# Patient Record
Sex: Female | Born: 1975 | Hispanic: No | Marital: Married | Smoking: Never smoker
Health system: Southern US, Community
[De-identification: ages and names within clinical notes are randomized; demographics above are authoritative.]

## PROBLEM LIST (undated history)

## (undated) DIAGNOSIS — G43909 Migraine, unspecified, not intractable, without status migrainosus: Secondary | ICD-10-CM

## (undated) DIAGNOSIS — R569 Unspecified convulsions: Secondary | ICD-10-CM

## (undated) DIAGNOSIS — L719 Rosacea, unspecified: Secondary | ICD-10-CM

## (undated) DIAGNOSIS — Z87440 Personal history of urinary (tract) infections: Secondary | ICD-10-CM

## (undated) HISTORY — DX: Migraine, unspecified, not intractable, without status migrainosus: G43.909

## (undated) HISTORY — DX: Rosacea, unspecified: L71.9

## (undated) HISTORY — PX: NO PAST SURGERIES: SHX2092

## (undated) HISTORY — DX: Unspecified convulsions: R56.9

## (undated) HISTORY — DX: Personal history of urinary (tract) infections: Z87.440

---

## 2001-04-11 ENCOUNTER — Encounter: Admission: RE | Admit: 2001-04-11 | Discharge: 2001-04-11 | Payer: Self-pay | Admitting: Obstetrics and Gynecology

## 2001-04-11 ENCOUNTER — Encounter: Payer: Self-pay | Admitting: Obstetrics and Gynecology

## 2001-06-22 ENCOUNTER — Ambulatory Visit (HOSPITAL_COMMUNITY): Admission: RE | Admit: 2001-06-22 | Discharge: 2001-06-22 | Payer: Self-pay

## 2001-08-25 ENCOUNTER — Encounter: Payer: Self-pay | Admitting: *Deleted

## 2001-08-25 ENCOUNTER — Ambulatory Visit (HOSPITAL_COMMUNITY): Admission: RE | Admit: 2001-08-25 | Discharge: 2001-08-25 | Payer: Self-pay | Admitting: *Deleted

## 2001-09-14 ENCOUNTER — Ambulatory Visit (HOSPITAL_COMMUNITY): Admission: RE | Admit: 2001-09-14 | Discharge: 2001-09-14 | Payer: Self-pay | Admitting: *Deleted

## 2001-09-14 ENCOUNTER — Encounter: Payer: Self-pay | Admitting: *Deleted

## 2001-11-16 ENCOUNTER — Inpatient Hospital Stay (HOSPITAL_COMMUNITY): Admission: AD | Admit: 2001-11-16 | Discharge: 2001-11-16 | Payer: Self-pay

## 2001-12-14 ENCOUNTER — Encounter (HOSPITAL_COMMUNITY): Admission: RE | Admit: 2001-12-14 | Discharge: 2002-01-06 | Payer: Self-pay

## 2002-01-07 ENCOUNTER — Inpatient Hospital Stay (HOSPITAL_COMMUNITY): Admission: AD | Admit: 2002-01-07 | Discharge: 2002-01-10 | Payer: Self-pay

## 2002-01-11 ENCOUNTER — Encounter: Admission: RE | Admit: 2002-01-11 | Discharge: 2002-02-10 | Payer: Self-pay

## 2003-06-07 ENCOUNTER — Emergency Department (HOSPITAL_COMMUNITY): Admission: EM | Admit: 2003-06-07 | Discharge: 2003-06-07 | Payer: Self-pay | Admitting: Emergency Medicine

## 2003-06-25 ENCOUNTER — Emergency Department (HOSPITAL_COMMUNITY): Admission: EM | Admit: 2003-06-25 | Discharge: 2003-06-25 | Payer: Self-pay

## 2003-06-26 ENCOUNTER — Encounter: Admission: RE | Admit: 2003-06-26 | Discharge: 2003-06-26 | Payer: Self-pay | Admitting: Family Medicine

## 2003-07-17 ENCOUNTER — Encounter: Admission: RE | Admit: 2003-07-17 | Discharge: 2003-09-09 | Payer: Self-pay | Admitting: Chiropractic Medicine

## 2004-09-02 ENCOUNTER — Encounter: Admission: RE | Admit: 2004-09-02 | Discharge: 2004-09-02 | Payer: Self-pay | Admitting: Family Medicine

## 2005-06-02 ENCOUNTER — Other Ambulatory Visit: Admission: RE | Admit: 2005-06-02 | Discharge: 2005-06-02 | Payer: Self-pay | Admitting: Obstetrics and Gynecology

## 2005-12-29 ENCOUNTER — Encounter: Admission: RE | Admit: 2005-12-29 | Discharge: 2005-12-29 | Payer: Self-pay | Admitting: Family Medicine

## 2007-06-13 ENCOUNTER — Other Ambulatory Visit: Admission: RE | Admit: 2007-06-13 | Discharge: 2007-06-13 | Payer: Self-pay | Admitting: Obstetrics and Gynecology

## 2009-04-17 ENCOUNTER — Other Ambulatory Visit: Admission: RE | Admit: 2009-04-17 | Discharge: 2009-04-17 | Payer: Self-pay | Admitting: Obstetrics and Gynecology

## 2009-04-30 ENCOUNTER — Encounter: Admission: RE | Admit: 2009-04-30 | Discharge: 2009-04-30 | Payer: Self-pay | Admitting: Obstetrics and Gynecology

## 2012-05-27 ENCOUNTER — Other Ambulatory Visit: Payer: Self-pay | Admitting: Neurology

## 2014-11-22 ENCOUNTER — Other Ambulatory Visit: Payer: Self-pay

## 2014-11-22 DIAGNOSIS — Z1231 Encounter for screening mammogram for malignant neoplasm of breast: Secondary | ICD-10-CM

## 2018-07-19 ENCOUNTER — Encounter: Payer: Self-pay | Admitting: Family Medicine

## 2018-07-19 ENCOUNTER — Other Ambulatory Visit: Payer: Self-pay

## 2018-07-19 ENCOUNTER — Ambulatory Visit (INDEPENDENT_AMBULATORY_CARE_PROVIDER_SITE_OTHER): Payer: Managed Care, Other (non HMO) | Admitting: Family Medicine

## 2018-07-19 VITALS — BP 112/62 | HR 92 | Temp 98.7°F | Ht 68.0 in | Wt 166.4 lb

## 2018-07-19 DIAGNOSIS — Z0000 Encounter for general adult medical examination without abnormal findings: Secondary | ICD-10-CM | POA: Diagnosis not present

## 2018-07-19 NOTE — Progress Notes (Signed)
Chief Complaint  Patient presents with  . New Patient (Initial Visit)     Well Woman DELCIE Villa is here for a complete physical.   Her last physical was >1 year ago.  Current diet: in general, an "OK" diet. Current exercise: none. Weight is increasing and she denies daytime fatigue. No LMP recorded.  Seatbelt? Yes  Health Maintenance Pap/HPV- Yes 1 mo ago Mammogram- Yes Due Tetanus- Yes- 2 years ago HIV screening- Yes - 16 yrs ago  Past Medical History:  Diagnosis Date  . History of recurrent UTIs   . Migraines   . Seizures ([place])      History reviewed. No pertinent surgical history.  Medications  Takes no meds routinely.    Allergies No Known Allergies  Review of Systems: Constitutional:  no unexpected weight changes Eye:  Vision has started to get worse Ear/Nose/Mouth/Throat:  Ears:  no tinnitus or vertigo and no recent change in hearing Nose/Mouth/Throat:  no complaints of nasal congestion, no sore throat Cardiovascular: no chest pain Respiratory:  no cough and no shortness of breath Gastrointestinal:  no abdominal pain, no change in bowel habits GU:  Female: negative for dysuria or pelvic pain Musculoskeletal/Extremities:  no pain of the joints Integumentary (Skin/Breast):  no abnormal skin lesions reported Neurologic:  no headaches Endocrine:  denies fatigue Hematologic/Lymphatic:  No areas of easy bleeding  Exam BP 112/62 (BP Location: Left Arm, Patient Position: Sitting, Cuff Size: Normal)   Pulse 92   Temp 98.7 F (37.1 C) (Oral)   Ht 5\' 8"  (1.727 m)   Wt 166 lb 6 oz (75.5 kg)   SpO2 98%   BMI 25.30 kg/m  General:  well developed, well nourished, in no apparent distress Skin:  no significant moles, warts, or growths Head:  no masses, lesions, or tenderness Eyes:  pupils equal and round, sclera anicteric without injection Ears:  canals without lesions, TMs shiny without retraction, no obvious effusion, no erythema Nose:  nares patent,  septum midline, mucosa normal, and no drainage or sinus tenderness Throat/Pharynx:  lips and gingiva without lesion; tongue and uvula midline; non-inflamed pharynx; no exudates or postnasal drainage Neck: neck supple without adenopathy, thyromegaly, or masses Lungs:  clear to auscultation, breath sounds equal bilaterally, no respiratory distress Cardio:  regular rate and rhythm, no bruits, no LE edema Abdomen:  abdomen soft, nontender; bowel sounds normal; no masses or organomegaly Genital: Defer to GYN Musculoskeletal:  symmetrical muscle groups noted without atrophy or deformity Extremities:  no clubbing, cyanosis, or edema, no deformities, no skin discoloration Neuro:  gait normal; deep tendon reflexes normal and symmetric Psych: well oriented with normal range of affect and appropriate judgment/insight  Assessment and Plan  Well adult exam - Plan: CBC, Comprehensive metabolic panel, Lipid panel  Well 43 y.o. female. Counseled on diet and exercise. Yoga, lift wts.  Other orders as above. Follow up 1 yr for CPE or prn. The patient voiced understanding and agreement to the plan.  [place], DO 07/19/18 1:48 PM

## 2018-07-19 NOTE — Patient Instructions (Signed)
Keep the diet clean and stay active.  Give [place] 2-3 business days to get the results of your labs back.   Aim to do some physical exertion for 150 minutes per week. This is typically divided into 5 days per week, 30 minutes per day. The activity should be enough to get your heart rate up. Anything is better than nothing if you have time constraints. Consider yoga and lifting weights.  Let us know if you need anything.

## 2019-02-08 ENCOUNTER — Other Ambulatory Visit: Payer: Self-pay

## 2019-02-08 ENCOUNTER — Ambulatory Visit (INDEPENDENT_AMBULATORY_CARE_PROVIDER_SITE_OTHER): Payer: Managed Care, Other (non HMO) | Admitting: Internal Medicine

## 2019-02-08 ENCOUNTER — Telehealth: Payer: Self-pay

## 2019-02-08 ENCOUNTER — Encounter: Payer: Self-pay | Admitting: Internal Medicine

## 2019-02-08 DIAGNOSIS — L719 Rosacea, unspecified: Secondary | ICD-10-CM

## 2019-02-08 MED ORDER — DOXYCYCLINE HYCLATE 50 MG PO CAPS: 50.0000 mg | ORAL_CAPSULE | Freq: Two times a day (BID) | ORAL | 0 refills | Status: DC

## 2019-02-08 MED ORDER — METRONIDAZOLE 0.75 % EX CREA: TOPICAL_CREAM | Freq: Two times a day (BID) | CUTANEOUS | 3 refills | Status: DC

## 2019-02-08 NOTE — Telephone Encounter (Signed)
Copied from CRM [id number]. Topic: Appointment Scheduling - Scheduling Inquiry for Clinic >> Feb 08, 2019 10:32 AM Fanny Bien wrote: Reason for CRM: pt called and stated that she has rosea on both of her cheeks and would like to schedule an appointment today. Please advise

## 2019-02-08 NOTE — Progress Notes (Signed)
Subjective:    Patient ID: Pamela Villa, female    DOB: [birth year], 44 y.o.   MRN: [id number]  DOS:  02/08/2019 Type of visit - description: Virtual Visit via Video Note  I connected with above patient by a video enabled telemedicine application and verified that I am speaking with the correct person using two identifiers.   THIS ENCOUNTER IS A VIRTUAL VISIT DUE TO COVID-19 - PATIENT WAS NOT SEEN IN THE OFFICE. PATIENT HAS CONSENTED TO VIRTUAL VISIT / TELEMEDICINE VISIT   Location of patient: home  Location of provider: office  I discussed the limitations of evaluation and management by telemedicine and the availability of in person appointments. The patient expressed understanding and agreed to proceed.  Acute visit History of rosacea, diagnosed 3.5 years ago by dermatology. She responded well to po antibiotics. She has been well without any medication for a while but sxs  resurfaced lately.    Review of Systems Denies any other problems  Past Medical History:  Diagnosis Date  . History of recurrent UTIs   . Migraines   . Seizures ([place])     No past surgical history on file.  Social History   Socioeconomic History  . Marital status: Married    Spouse name: Not on file  . Number of children: Not on file  . Years of education: Not on file  . Highest education level: Not on file  Occupational History  . Not on file  Tobacco Use  . Smoking status: Never Smoker  . Smokeless tobacco: Never Used  Substance and Sexual Activity  . Alcohol use: Not on file    Comment: rarely  . Drug use: Never  . Sexual activity: Not on file  Other Topics Concern  . Not on file  Social History Narrative  . Not on file   Social Determinants of Health   Financial Resource Strain:   . Difficulty of Paying Living Expenses: Not on file  Food Insecurity:   . Worried About Charity fundraiser in the Last Year: Not on file  . Ran Out of Food in the Last Year: Not on file    Transportation Needs:   . Lack of Transportation (Medical): Not on file  . Lack of Transportation (Non-Medical): Not on file  Physical Activity:   . Days of Exercise per Week: Not on file  . Minutes of Exercise per Session: Not on file  Stress:   . Feeling of Stress : Not on file  Social Connections:   . Frequency of Communication with Friends and Family: Not on file  . Frequency of Social Gatherings with Friends and Family: Not on file  . Attends Religious Services: Not on file  . Active Member of Clubs or Organizations: Not on file  . Attends Archivist Meetings: Not on file  . Marital Status: Not on file  Intimate Partner Violence:   . Fear of Current or Ex-Partner: Not on file  . Emotionally Abused: Not on file  . Physically Abused: Not on file  . Sexually Abused: Not on file      Allergies as of 02/08/2019   No Known Allergies     Medication List    as of February 08, 2019  1:22 PM   You have not been prescribed any medications.         Objective:   Physical Exam HENT:     Head:     There were no vitals taken for this  visit. This is a virtual video visit, she is alert oriented x3, I was able to see her face, she does have some redness.  See graphic    Assessment    44 year old female, with history of rosacea presents with  Rosacea exacerbation. Currently taking no medication other than BCPs, rosacea is now exacerbated. Previously doxycycline 100 mg twice a day help but it caused some stomach discomfort. Plan: Doxycycline 50 mg twice a day for 1 month Start metronidazole topically. Reassess in 6 weeks by PCP, may need long-term doxycycline. She is aware that BCPs will make BCPs less effective, recommend additional measures, condoms?.    I discussed the assessment and treatment plan with the patient. The patient was provided an opportunity to ask questions and all were answered. The patient agreed with the plan and demonstrated an understanding  of the instructions.   The patient was advised to call back or seek an in-person evaluation if the symptoms worsen or if the condition fails to improve as anticipated.

## 2019-02-08 NOTE — Telephone Encounter (Signed)
LM requesting call back to schedule appt with PCP and discuss rosacea.

## 2019-03-04 ENCOUNTER — Other Ambulatory Visit: Payer: Self-pay | Admitting: Internal Medicine

## 2019-07-04 ENCOUNTER — Encounter: Payer: Self-pay | Admitting: Family Medicine

## 2019-08-10 ENCOUNTER — Emergency Department (HOSPITAL_BASED_OUTPATIENT_CLINIC_OR_DEPARTMENT_OTHER)
Admission: EM | Admit: 2019-08-10 | Discharge: 2019-08-10 | Disposition: A | Discharge status: Home / Self Care | Payer: Managed Care, Other (non HMO) | Attending: Emergency Medicine | Admitting: Emergency Medicine

## 2019-08-10 ENCOUNTER — Other Ambulatory Visit: Payer: Self-pay

## 2019-08-10 ENCOUNTER — Encounter (HOSPITAL_BASED_OUTPATIENT_CLINIC_OR_DEPARTMENT_OTHER): Payer: Self-pay | Admitting: *Deleted

## 2019-08-10 ENCOUNTER — Emergency Department (HOSPITAL_BASED_OUTPATIENT_CLINIC_OR_DEPARTMENT_OTHER): Payer: Managed Care, Other (non HMO)

## 2019-08-10 DIAGNOSIS — R202 Paresthesia of skin: Secondary | ICD-10-CM | POA: Insufficient documentation

## 2019-08-10 DIAGNOSIS — R42 Dizziness and giddiness: Secondary | ICD-10-CM | POA: Diagnosis not present

## 2019-08-10 DIAGNOSIS — I10 Essential (primary) hypertension: Secondary | ICD-10-CM | POA: Insufficient documentation

## 2019-08-10 DIAGNOSIS — R072 Precordial pain: Secondary | ICD-10-CM | POA: Insufficient documentation

## 2019-08-10 DIAGNOSIS — Z3202 Encounter for pregnancy test, result negative: Secondary | ICD-10-CM | POA: Diagnosis not present

## 2019-08-10 LAB — BASIC METABOLIC PANEL
Anion gap: 12 (ref 5–15)
BUN: 9 mg/dL (ref 6–20)
CO2: 21 mmol/L — ABNORMAL LOW (ref 22–32)
Calcium: 8.6 mg/dL — ABNORMAL LOW (ref 8.9–10.3)
Chloride: 102 mmol/L (ref 98–111)
Creatinine, Ser: 0.91 mg/dL (ref 0.44–1.00)
GFR calc Af Amer: >60 mL/min (ref >60)
GFR calc non Af Amer: >60 mL/min (ref >60)
Glucose, Bld: 99 mg/dL (ref 70–99)
Potassium: 3.7 mmol/L (ref 3.5–5.1)
Sodium: 135 mmol/L (ref 135–145)

## 2019-08-10 LAB — CBC
HCT: 39.1 % (ref 36.0–46.0)
Hemoglobin: 13.1 g/dL (ref 12.0–15.0)
MCH: 30.4 pg (ref 26.0–34.0)
MCHC: 33.5 g/dL (ref 30.0–36.0)
MCV: 90.7 fL (ref 80.0–100.0)
Platelets: 301 10*3/uL (ref 150–400)
RBC: 4.31 MIL/uL (ref 3.87–5.11)
RDW: 12.4 % (ref 11.5–15.5)
WBC: 6.4 10*3/uL (ref 4.0–10.5)
nRBC: 0 % (ref 0.0–0.2)

## 2019-08-10 LAB — TROPONIN I (HIGH SENSITIVITY): Troponin I (High Sensitivity): 2 ng/L (ref <18)

## 2019-08-10 LAB — PREGNANCY, URINE: Preg Test, Ur: NEGATIVE

## 2019-08-10 LAB — D-DIMER, QUANTITATIVE: D-Dimer, Quant: 0.58 ug/mL-FEU — ABNORMAL HIGH (ref 0.00–0.50)

## 2019-08-10 MED ORDER — IOHEXOL 350 MG/ML SOLN
100.0000 mL | Freq: Once | INTRAVENOUS | Status: AC | PRN
  Administered 2019-08-10: 58 mL via INTRAVENOUS

## 2019-08-10 MED ORDER — ALPRAZOLAM 0.25 MG PO TABS: 0.2500 mg | ORAL_TABLET | Freq: Two times a day (BID) | ORAL | 0 refills | Status: DC | PRN

## 2019-08-10 NOTE — ED Provider Notes (Signed)
[facility] EMERGENCY DEPARTMENT Provider Note   CSN: [id number] Arrival date & time: 08/10/19  1458     History Chief Complaint  Patient presents with  . Palpitations    Pamela Villa is a 44 y.o. woman with history of anxiety who presents to the ED with a two-day history of chest pain and bilateral arm paresthesia.  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 She describes the pain as "like when you pull a muscle." The pain did not radiate, and it was not worse with inspiration. Shortly after noticing the pain, she noticed that her "arms felt funny," "different but similar to pins and needles." The chest pain and arm sensation eventually subsided, and she continued about her day. She reports today, she had an uneventful morning, had lunch with one of her daughters, and then while driving suddenly experienced the same chest pain as yesterday except that the pain was also associated with a "shooting pain" down her left arm associated with lightheadedness. She reports her arms starting tingling again, but worse than yesterday, and she became concerned she may be having a heart attack.   Currently, she states her chest pain is "under the surface" and 1/10 in intensity. Denies heart racing or palpitations. States her arms are currently not tingling, denies any neck pain. Denies recent fever/chills, HA, SOB, abdominal pain, N/V, urinary symptoms, or leg pain. Reports she does take birth control pills and took a 14-hr car ride to [place] about a month ago. Denies recent trauma.  The patient reports she has been under increased stress lately as she as increased professional responsibilities, one of her children recently moved far away, and there have been deaths in the family and among family friends. She reports having a prescription for Xanax, which she states she takes  occasionally for increased stress. She denies taking any yesterday or today. States in the last two weeks her mood is "weepy," she is less able to enjoy the things she typically enjoys, has feelings of guilt, has decreased energy, does not have her usual appetite. Denies any feelings of SI or HI.    Past Medical History:  Diagnosis Date  . History of recurrent UTIs   . Migraines   . Seizures ([facility])     There are no problems to display for this patient.   History reviewed. No pertinent surgical history.   OB History   No obstetric history on file.     Family History  Problem Relation Age of Onset  . Early death Mother   . Arthritis Father   . Parkinson's disease Brother     Social History   Tobacco Use  . Smoking status: Never Smoker  . Smokeless tobacco: Never Used  Substance Use Topics  . Alcohol use: Not on file    Comment: rarely  . Drug use: Never    Home Medications Prior to Admission medications   Medication Sig Start Date End Date Taking? Authorizing Provider  ALPRAZolam (XANAX) 0.25 MG tablet Take 1 tablet (0.25 mg total) by mouth 2 (two) times daily as needed for anxiety. 08/10/19   Long, Arlyss Repress, MD  doxycycline (VIBRAMYCIN) 50 MG capsule Take 1 capsule (50 mg total) by mouth 2 (two) times daily. 02/08/19   Wanda Plump, MD  metroNIDAZOLE (METROCREAM) 0.75 % cream Apply topically 2 (two) times daily. 02/08/19  Paz, Nolon Rod, MD  norethindrone-ethinyl estradiol (LOESTRIN FE) 1-20 MG-MCG tablet Take 1 tablet by mouth daily.    [provider]    Allergies    Patient has no known allergies.  Review of Systems   Review of Systems  Constitutional: Positive for appetite change and fatigue. Negative for chills, diaphoresis and fever.  HENT: Negative for congestion.   Eyes: Negative for visual disturbance.  Respiratory: Positive for chest tightness. Negative for cough and shortness of breath.   Cardiovascular: Positive for chest pain. Negative for  palpitations and leg swelling.  Gastrointestinal: Negative for abdominal pain, diarrhea, nausea and vomiting.  Genitourinary: Negative for dysuria.  Musculoskeletal: Negative for arthralgias and back pain.  Skin: Negative for rash.  Neurological: Positive for light-headedness. Negative for dizziness.    Physical Exam Updated Vital Signs BP (!) 149/72   Pulse 82   Temp 99.1 F (37.3 C) (Oral)   Resp (!) 22   Ht 5\' 8"  (1.727 m)   Wt 74.4 kg   SpO2 99%   BMI 24.94 kg/m   Physical Exam Constitutional:      General: She is not in acute distress.    Appearance: She is not ill-appearing.  HENT:     Head: Normocephalic and atraumatic.     Mouth/Throat:     Mouth: Mucous membranes are moist.  Eyes:     Extraocular Movements: Extraocular movements intact.     Conjunctiva/sclera: Conjunctivae normal.     Pupils: Pupils are equal, round, and reactive to light.  Cardiovascular:     Rate and Rhythm: Normal rate and regular rhythm.     Pulses: Normal pulses.     Heart sounds: Normal heart sounds.  Pulmonary:     Effort: Pulmonary effort is normal.     Breath sounds: Normal breath sounds.  Abdominal:     General: Abdomen is flat. Bowel sounds are normal.     Palpations: Abdomen is soft.  Musculoskeletal:     Cervical back: Normal range of motion.  Skin:    General: Skin is warm and dry.  Neurological:     Mental Status: She is alert and oriented to person, place, and time.     Motor: No weakness.  Psychiatric:        Mood and Affect: Affect is tearful.        Speech: Speech normal.        Behavior: Behavior is cooperative.        Thought Content: Thought content normal. Thought content does not include homicidal or suicidal ideation.     ED Results / Procedures / Treatments   Labs (all labs ordered are listed, but only abnormal results are displayed) Labs Reviewed  BASIC METABOLIC PANEL - Abnormal; Notable for the following components:      Result Value   CO2 21 (*)      Calcium 8.6 (*)    All other components within normal limits  D-DIMER, QUANTITATIVE (NOT AT [facility]) - Abnormal; Notable for the following components:   D-Dimer, Quant 0.58 (*)    All other components within normal limits  CBC  PREGNANCY, URINE  TROPONIN I (HIGH SENSITIVITY)    EKG EKG Interpretation  Date/Time:  Friday August 10 2019 15:10:05 EDT Ventricular Rate:  98 PR Interval:    QRS Duration: 85 QT Interval:  364 QTC Calculation: 465 R Axis:   73 Text Interpretation: Sinus rhythm No STEMI Confirmed by [birth year]1985 [phone]) on 08/10/2019 3:13:09 PM  Radiology CT Angio Chest PE W and/or Wo Contrast  Result Date: 08/10/2019 CLINICAL DATA:  Bilateral upper extremity numbness, chest pain at EXAM: CT ANGIOGRAPHY CHEST WITH CONTRAST TECHNIQUE: Multidetector CT imaging of the chest was performed using the standard protocol during bolus administration of intravenous contrast. Multiplanar CT image reconstructions and MIPs were obtained to evaluate the vascular anatomy. CONTRAST:  58mL OMNIPAQUE IOHEXOL 350 MG/ML SOLN COMPARISON:  08/10/2019 FINDINGS: Cardiovascular: This is a technically adequate evaluation of the pulmonary vasculature. There are no filling defects or pulmonary emboli. The heart is unremarkable without pericardial effusion. The thoracic aorta is grossly normal. Mediastinum/Nodes: No enlarged mediastinal, hilar, or axillary lymph nodes. Thyroid gland, trachea, and esophagus demonstrate no significant findings. Lungs/Pleura: No acute airspace disease, effusion, or pneumothorax. Central airways are patent. Upper Abdomen: No acute abnormality. Musculoskeletal: No acute or destructive bony lesions. Reconstructed images demonstrate no additional findings. Review of the MIP images confirms the above findings. IMPRESSION: 1. No evidence of pulmonary embolus. 2. No acute intrathoracic process. Electronically Signed   By: Sharlet SalinaMichael  Brown M.D.   On: 08/10/2019 17:43   DG Chest Portable 1  View  Result Date: 08/10/2019 CLINICAL DATA:  Chest pain EXAM: PORTABLE CHEST 1 VIEW COMPARISON:  06/07/2003 FINDINGS: The heart size and mediastinal contours are within normal limits. Both lungs are clear. The visualized skeletal structures are unremarkable. IMPRESSION: No active disease. Electronically Signed   By: Helyn NumbersAshesh  Parikh MD   On: 08/10/2019 16:00    Medications Ordered in ED Medications  iohexol (OMNIPAQUE) 350 MG/ML injection 100 mL (58 mLs Intravenous Contrast Given 08/10/19 1714)    ED Course  I have reviewed the triage vital signs and the nursing notes.  Pertinent labs & imaging results that were available during my care of the patient were reviewed by me and considered in my medical decision making (see chart for details).    MDM Rules/Calculators/A&P                          Merideth AbbeyJennifer A Sutphin is a 44 y.o. woman with history of anxiety who presents to the ED with a two-day history of chest pain and bilateral arm paresthesia.  Patient is well-appearing and mildly hypertensive but otherwise stable vitals. Physical exam unremarkable, without focal neurologic deficits. The patient's presentation is most consistent with acute anxiety and/or adjustment disorder with depressed mood, however given her chest pain, OCP use, and recent long car ride, cannot rule out MI or pulmonary embolism. Will obtain basic labs (CBC and BMP), troponin, EKG, and CXR. She meets zero of the [place] Criteria, however with her hormone use we cannot rule out PE, so will screen with a D-dimer. Low concern for neurologic etiology of her symptoms as her exam is non-focal. No any indication for cervical spine imaging.   4:13 PM CBC and BMP unremarkable. EKG demonstrates NSR. CXR unremarkable.   4:17 PM D-Dimer elevated at 0.58. Troponin wnl. Will obtain CT Angio Chest PE protocol.  CT negative for pulmonary embolus. Patient discharged home with PRN alprazolam. Counseled on return precautions.   Final  Clinical Impression(s) / ED Diagnoses Final diagnoses:  Precordial chest pain  Tingling in extremities    Rx / DC Orders ED Discharge Orders         Ordered    ALPRAZolam (XANAX) 0.25 MG tablet  2 times daily PRN     Discontinue  Reprint     08/10/19 1756  Alphonzo Severance, MD 08/10/19 2257    Maia Plan, MD 08/18/19 2100

## 2019-08-10 NOTE — Discharge Instructions (Addendum)
You were seen in the emergency room today with some tingling in the extremities along with mild chest discomfort.  Your labs and CT imaging today did not show evidence of a heart attack or blood clot in the lungs.  Please follow closely with your primary care doctor.  If they have prescribed you medication for anxiety you can begin taking this.  Please monitor your symptoms closely at home and return to the emergency department immediately if you develop worsening chest pain, shortness of breath, lightheadedness, or weakness/numbness on 1 side of her body or the other.

## 2019-08-10 NOTE — ED Notes (Signed)
Back from CT

## 2019-08-10 NOTE — ED Notes (Signed)
Taken to CT at this time. 

## 2019-08-10 NOTE — ED Triage Notes (Signed)
Palpitations on and off since last night. She felt it was anxiety related. Today she decided to be seen. Her arms are aching.

## 2019-08-15 ENCOUNTER — Ambulatory Visit: Payer: Managed Care, Other (non HMO) | Admitting: Family Medicine

## 2019-08-15 ENCOUNTER — Encounter: Payer: Self-pay | Admitting: Family Medicine

## 2019-08-15 ENCOUNTER — Other Ambulatory Visit: Payer: Self-pay

## 2019-08-15 VITALS — BP 118/80 | HR 93 | Temp 98.3°F | Ht 68.0 in | Wt 166.5 lb

## 2019-08-15 DIAGNOSIS — L719 Rosacea, unspecified: Secondary | ICD-10-CM | POA: Diagnosis not present

## 2019-08-15 DIAGNOSIS — R0789 Other chest pain: Secondary | ICD-10-CM | POA: Diagnosis not present

## 2019-08-15 DIAGNOSIS — F411 Generalized anxiety disorder: Secondary | ICD-10-CM | POA: Diagnosis not present

## 2019-08-15 MED ORDER — ALPRAZOLAM 0.25 MG PO TABS: 0.2500 mg | ORAL_TABLET | Freq: Two times a day (BID) | ORAL | 2 refills | Status: AC | PRN

## 2019-08-15 MED ORDER — METRONIDAZOLE 0.75 % EX CREA: TOPICAL_CREAM | Freq: Two times a day (BID) | CUTANEOUS | 3 refills | Status: DC

## 2019-08-15 MED ORDER — DOXYCYCLINE HYCLATE 50 MG PO CAPS: 50.0000 mg | ORAL_CAPSULE | Freq: Two times a day (BID) | ORAL | 3 refills | Status: DC

## 2019-08-15 NOTE — Patient Instructions (Signed)
Let me know if you want to see a cardiologist.  Please consider counseling. Contact [phone] to schedule an appointment or inquire about cost/insurance coverage.  Aim to do some physical exertion for 150 minutes per week. This is typically divided into 5 days per week, 30 minutes per day. The activity should be enough to get your heart rate up. Anything is better than nothing if you have time constraints.  Coping skills Choose 5 that work for you:  Take a deep breath  Count to 20  Read a book  Do a puzzle  Meditate  Bake  Sing  Knit  Garden  Pray  Go outside  Call a friend  Listen to music  Take a walk  Color  Send a note  Take a bath  Watch a movie  Be alone in a quiet place  Pet an animal  Visit a friend  Journal  Exercise  Stretch   Let us know if you need anything.

## 2019-08-15 NOTE — Progress Notes (Signed)
Chief Complaint  Patient presents with  . Hospitalization Follow-up    Chest pain    Subjective: Patient is a 44 y.o. female here for ED f/u.  Central chest pain Duration of issue: 1 week Quality: sharp, sometimes like a pressure ("like someone is sitting on the chest") Palliation: Xanax Provocation: has had a lot of stress, no specific triggers  Severity: slight, difficulty to put a number on it Radiation: none Duration of chest pain: inconsistent; could last a couple minutes, up to several hours Associated symptoms: sob, some tingling in arms; no associated with meals or position Cardiac history: none Family heart history: none Smoker? No Work-up in the ED was unremarkable.  Past Medical History:  Diagnosis Date  . History of recurrent UTIs   . Migraines   . Seizures ([facility])     Objective: BP 118/80 (BP Location: Right Arm, Patient Position: Sitting, Cuff Size: Normal)   Pulse 93   Temp 98.3 F (36.8 C) (Oral)   Ht 5\' 8"  (1.727 m)   Wt 166 lb 8 oz (75.5 kg)   SpO2 99%   BMI 25.32 kg/m  General: Awake, appears stated age HEENT: MMM, EOMi Heart: RRR, no bruits or lower extremity edema Lungs: CTAB, no rales, wheezes or rhonchi. No accessory muscle use MSK: Chest pain is not reproducible to palpation Psych: Age appropriate judgment and insight, normal affect and mood  Assessment and Plan: GAD (generalized anxiety disorder) - Plan: ALPRAZolam (XANAX) 0.25 MG tablet  Atypical chest pain  Rosacea - Plan: doxycycline (VIBRAMYCIN) 50 MG capsule, metroNIDAZOLE (METROCREAM) 0.75 % cream  1.  She has generalized anxiety at baseline.  Based off of her work-up and lack of risk factors, I do think this is the correct diagnosis for her chest pain.  I will take over prescription of her Xanax from her GYN team.  If she uses it more than 10 times per month on average, will start a daily SSRI. 2.  Offered referral to cardiology, she declined at this time.  I think this is  reasonable.  We decided to see how things go after a month or so she gets further away from these family tragedies. 3.  Refill doxy and metronidazole.  She is not getting complete control, I did offer to refer her to a dermatologist but she declined at this time. I will see her for her annual exam or as needed. The patient voiced understanding and agreement to the plan.  [place], DO 08/15/19  9:11 AM

## 2019-12-12 ENCOUNTER — Other Ambulatory Visit: Payer: Self-pay | Admitting: Family Medicine

## 2019-12-12 DIAGNOSIS — L719 Rosacea, unspecified: Secondary | ICD-10-CM

## 2020-11-29 IMAGING — DX DG CHEST 1V PORT
1 series · 1 of 1 positions shown · non-contrast
Comparison: 06/07/2003

CLINICAL DATA: Chest pain

EXAM:
PORTABLE CHEST 1 VIEW

[chest ap]
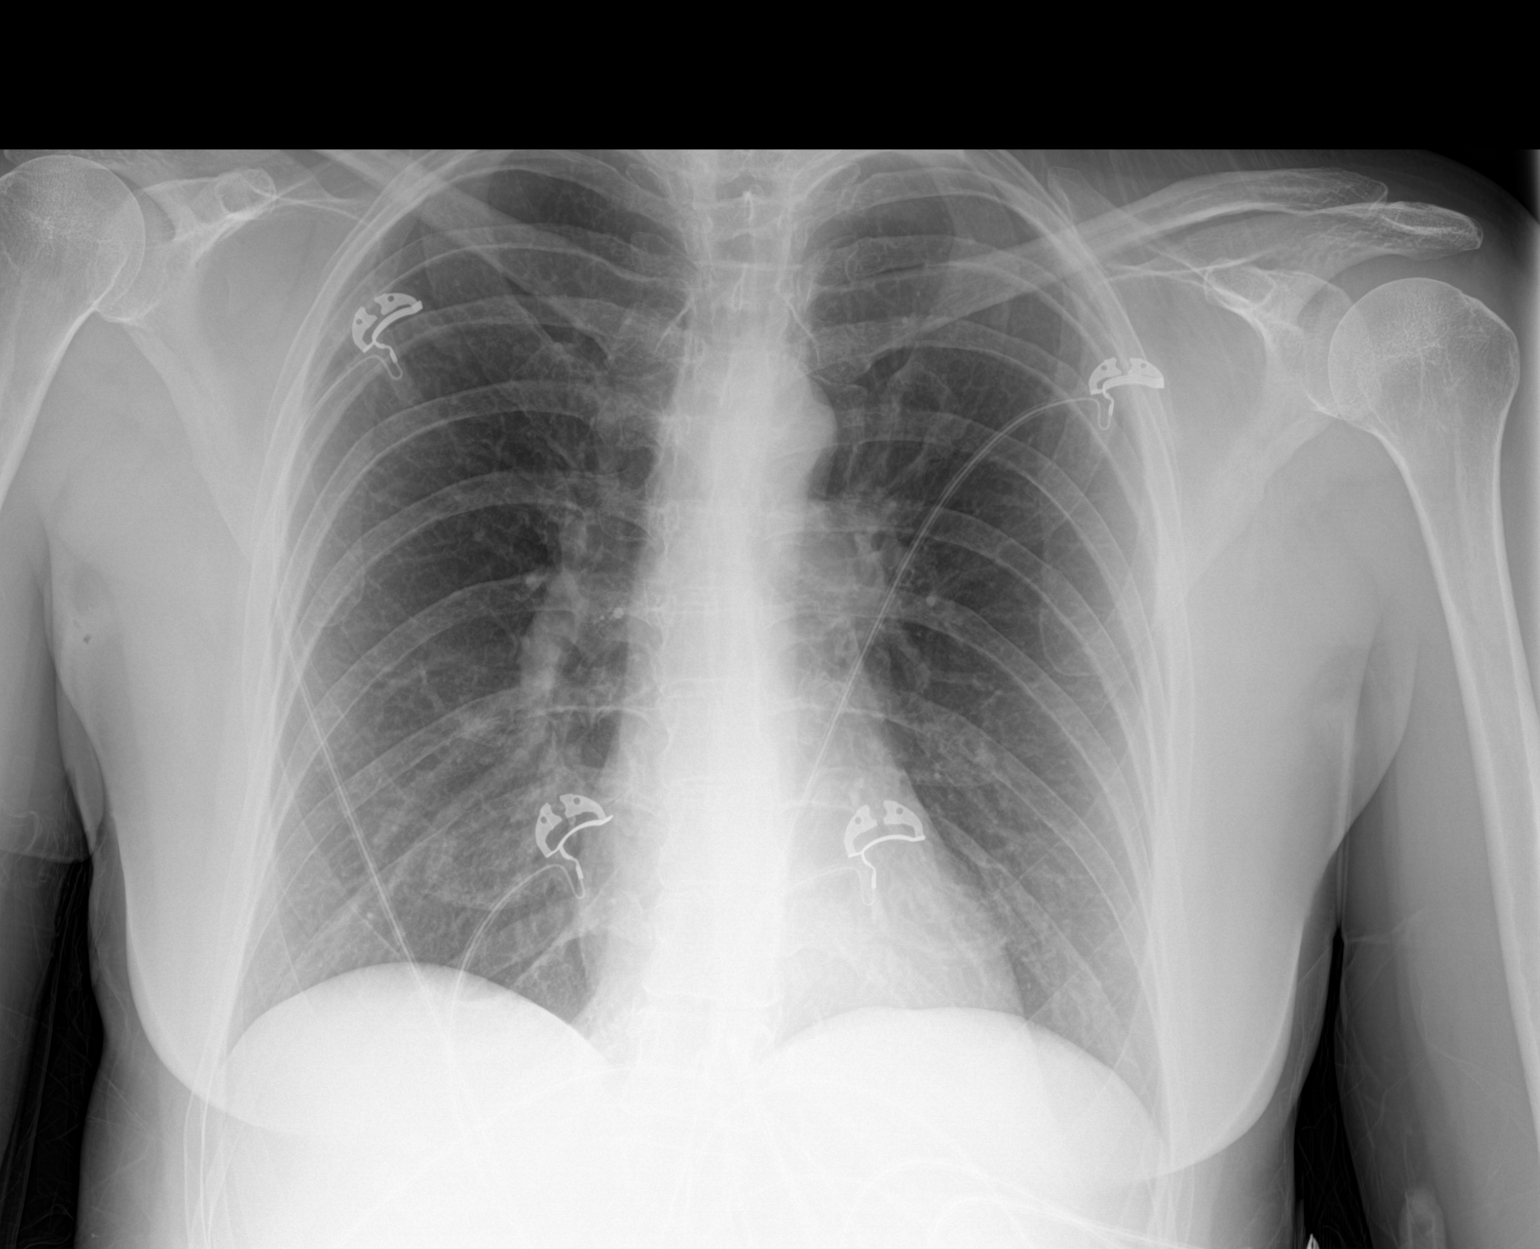

[1 of 1 positions shown; findings below may reference images not displayed]

FINDINGS: The heart size and mediastinal contours are within normal limits.
Both lungs are clear. The visualized skeletal structures are
unremarkable.
IMPRESSION: No active disease.

## 2020-12-08 ENCOUNTER — Encounter: Payer: Self-pay | Admitting: Physician Assistant

## 2020-12-08 ENCOUNTER — Telehealth (INDEPENDENT_AMBULATORY_CARE_PROVIDER_SITE_OTHER): Payer: Managed Care, Other (non HMO) | Admitting: Physician Assistant

## 2020-12-08 VITALS — HR 101 | Temp 104.0°F

## 2020-12-08 DIAGNOSIS — R509 Fever, unspecified: Secondary | ICD-10-CM

## 2020-12-08 DIAGNOSIS — J020 Streptococcal pharyngitis: Secondary | ICD-10-CM | POA: Diagnosis not present

## 2020-12-08 LAB — POCT RAPID STREP A (OFFICE): Rapid Strep A Screen: POSITIVE — AB

## 2020-12-08 LAB — POCT INFLUENZA A/B
Influenza A, POC: NEGATIVE
Influenza B, POC: NEGATIVE

## 2020-12-08 MED ORDER — AMOXICILLIN 875 MG PO TABS
875.0000 mg | ORAL_TABLET | Freq: Two times a day (BID) | ORAL | 0 refills | Status: AC
Start: 2020-12-08 — End: 2020-12-18

## 2020-12-08 NOTE — Progress Notes (Signed)
Virtual Visit via Video   I connected with Pamela Villa on 12/08/20 at  1:00 PM EST by a video enabled telemedicine application and verified that I am speaking with the correct person using two identifiers. Location patient: Home Location provider: [facility], Office Persons participating in the virtual visit: Pamela Villa, Jarold Motto PA-C,   I discussed the limitations of evaluation and management by telemedicine and the availability of in person appointments. The patient expressed understanding and agreed to proceed.   Subjective:   HPI:   Sore throat Pt c/o sore throat and congestion started yesterday. She states that her skin hurts. She has a subjective fever. Traveled to [place] over the weekend but denies any known sick contacts. Negative COVID test today.  She is working on hydration. Denies: chest pain, SOB, vomiting.  ROS: See pertinent positives and negatives per HPI.  There are no problems to display for this patient.   Social History   Tobacco Use   Smoking status: Never   Smokeless tobacco: Never  Substance Use Topics   Alcohol use: Not on file    Comment: rarely    Current Outpatient Medications:    amoxicillin (AMOXIL) 875 MG tablet, Take 1 tablet (875 mg total) by mouth 2 (two) times daily for 10 days., Disp: 20 tablet, Rfl: 0   ALPRAZolam (XANAX) 0.25 MG tablet, Take 1 tablet (0.25 mg total) by mouth 2 (two) times daily as needed for anxiety., Disp: 30 tablet, Rfl: 2   doxycycline (VIBRAMYCIN) 50 MG capsule, TAKE 1 CAPSULE (50 MG TOTAL) BY MOUTH 2 (TWO) TIMES DAILY., Disp: 60 capsule, Rfl: 3   metroNIDAZOLE (METROCREAM) 0.75 % cream, Apply topically 2 (two) times daily., Disp: 45 g, Rfl: 3   norethindrone-ethinyl estradiol (LOESTRIN FE) 1-20 MG-MCG tablet, Take 1 tablet by mouth daily., Disp: , Rfl:   No Known Allergies  Objective:   VITALS: Per patient if applicable, see vitals. GENERAL: Alert, appears well and in no acute  distress. HEENT: Atraumatic, conjunctiva clear, no obvious abnormalities on inspection of external nose and ears. NECK: Normal movements of the head and neck. CARDIOPULMONARY: No increased WOB. Speaking in clear sentences. I:E ratio WNL.  MS: Moves all visible extremities without noticeable abnormality. PSYCH: Pleasant and cooperative, well-groomed. Speech normal rate and rhythm. Affect is appropriate. Insight and judgement are appropriate. Attention is focused, linear, and appropriate.  NEURO: CN grossly intact. Oriented as arrived to appointment on time with no prompting. Moves both UE equally.  SKIN: No obvious lesions, wounds, erythema, or cyanosis noted on face or hands.  Results for orders placed or performed in visit on 12/08/20  POCT rapid strep A  Result Value Ref Range   Rapid Strep A Screen Positive (A) Negative  POCT Influenza A/B  Result Value Ref Range   Influenza A, POC Negative Negative   Influenza B, POC Negative Negative    Assessment and Plan:   Pamela Villa was seen today for sore throat.  Diagnoses and all orders for this visit:  Strep pharyngitis  Fever, unspecified fever cause -     POCT rapid strep A -     POCT Influenza A/B  Other orders -     amoxicillin (AMOXIL) 875 MG tablet; Take 1 tablet (875 mg total) by mouth 2 (two) times daily for 10 days.  Patient came to the parking lot today for flu and strep test. Flu test negative Strep test positive --we will start oral amoxicillin 875 mg twice daily for  10 days Recommend ongoing hydration with fluids If worsening symptoms, including uncontrolled fever, SOB, or concerns for dehydration, she was advised to follow-up with her seek medical attention if needed in urgent setting  I discussed the assessment and treatment plan with the patient. The patient was provided an opportunity to ask questions and all were answered. The patient agreed with the plan and demonstrated an understanding of the instructions.    The patient was advised to call back or seek an in-person evaluation if the symptoms worsen or if the condition fails to improve as anticipated.   CMA or LPN served as scribe during this visit. History, Physical, and Plan performed by medical provider. The above documentation has been reviewed and is accurate and complete.   [place], [place] 12/08/2020

## 2020-12-11 ENCOUNTER — Telehealth: Payer: Self-pay | Admitting: *Deleted

## 2020-12-11 NOTE — Telephone Encounter (Signed)
Pt calling was seen by Lelon Mast on Monday and was treated for strep throat started on Amoxicillin and was told to not take Doxycycline while taking medication. Pt said she has Rosacea and her face is bright red and burning wants to know if she can restart her Doxy or do something else? Told her Discuss with Dr. Jimmey Ralph and he said okay to restart your Doxycycline. Pt verbalized understanding.

## 2021-06-26 ENCOUNTER — Other Ambulatory Visit: Payer: Self-pay | Admitting: Family Medicine

## 2021-06-26 DIAGNOSIS — L719 Rosacea, unspecified: Secondary | ICD-10-CM

## 2021-06-26 NOTE — Telephone Encounter (Signed)
Called and scheduled appt. For CPE

## 2021-06-26 NOTE — Telephone Encounter (Signed)
Will send in refills. She is due for CPE, plz sched at her convenience. Ty.

## 2021-07-02 ENCOUNTER — Encounter: Payer: Self-pay | Admitting: Family Medicine

## 2021-07-02 ENCOUNTER — Ambulatory Visit (INDEPENDENT_AMBULATORY_CARE_PROVIDER_SITE_OTHER): Payer: Managed Care, Other (non HMO) | Admitting: Family Medicine

## 2021-07-02 VITALS — BP 106/75 | HR 79 | Temp 98.4°F | Ht 67.0 in | Wt 178.1 lb

## 2021-07-02 DIAGNOSIS — R635 Abnormal weight gain: Secondary | ICD-10-CM | POA: Diagnosis not present

## 2021-07-02 DIAGNOSIS — Z114 Encounter for screening for human immunodeficiency virus [HIV]: Secondary | ICD-10-CM | POA: Diagnosis not present

## 2021-07-02 DIAGNOSIS — Z0000 Encounter for general adult medical examination without abnormal findings: Secondary | ICD-10-CM

## 2021-07-02 DIAGNOSIS — L719 Rosacea, unspecified: Secondary | ICD-10-CM

## 2021-07-02 DIAGNOSIS — Z1159 Encounter for screening for other viral diseases: Secondary | ICD-10-CM

## 2021-07-02 DIAGNOSIS — Z1211 Encounter for screening for malignant neoplasm of colon: Secondary | ICD-10-CM | POA: Diagnosis not present

## 2021-07-02 MED ORDER — METRONIDAZOLE 0.75 % EX CREA: TOPICAL_CREAM | Freq: Two times a day (BID) | CUTANEOUS | 3 refills | Status: DC

## 2021-07-02 NOTE — Patient Instructions (Signed)
Give us 2-3 business days to get the results of your labs back.   Keep the diet clean and stay active.  Please get me a copy of your advanced directive form at your convenience.   Let us know if you need anything.  

## 2021-07-02 NOTE — Progress Notes (Signed)
Chief Complaint  Patient presents with   Annual Exam     Well Woman Pamela Villa is here for a complete physical.   Her last physical was >1 year ago.  Current diet: in general, a "healthy" diet. Current exercise: walking. Weight is increasing and she denies fatigue out of ordinary. Seatbelt? Yes Advanced directive? No  Health Maintenance Pap/HPV- Yes Mammogram- Yes Tetanus- Yes - 6 yrs Hep C screening- No HIV screening- No  Past Medical History:  Diagnosis Date   History of recurrent UTIs    Migraines    Rosacea    Seizures (HCC)      Past Surgical History:  Procedure Laterality Date   NO PAST SURGERIES      Medications  Current Outpatient Medications on File Prior to Visit  Medication Sig Dispense Refill   ALPRAZolam (XANAX) 0.25 MG tablet Take 1 tablet (0.25 mg total) by mouth 2 (two) times daily as needed for anxiety. 30 tablet 2   doxycycline (VIBRAMYCIN) 50 MG capsule TAKE 1 CAPSULE (50 MG TOTAL) BY MOUTH 2 (TWO) TIMES DAILY. 60 capsule 3   norethindrone-ethinyl estradiol (LOESTRIN FE) 1-20 MG-MCG tablet Take 1 tablet by mouth daily.      Allergies No Known Allergies  Review of Systems: Constitutional:  no unexpected weight changes Eye:  no recent significant change in vision Ear/Nose/Mouth/Throat:  Ears:  no recent change in hearing Nose/Mouth/Throat:  no complaints of nasal congestion, no sore throat Cardiovascular: no chest pain Respiratory:  no shortness of breath Gastrointestinal:  no abdominal pain, no change in bowel habits GU:  Female: negative for dysuria or pelvic pain Musculoskeletal/Extremities:  no pain of the joints Integumentary (Skin/Breast):  no abnormal skin lesions reported Neurologic:  no headaches Endocrine:  denies fatigue Hematologic/Lymphatic:  No areas of easy bleeding  Exam BP 106/75   Pulse 79   Temp 98.4 F (36.9 C) (Oral)   Ht 5\' 7"  (1.702 m)   Wt 178 lb 2 oz (80.8 kg)   SpO2 97%   BMI 27.90 kg/m  General:   well developed, well nourished, in no apparent distress Skin:  no significant moles, warts, or growths Head:  no masses, lesions, or tenderness Eyes:  pupils equal and round, sclera anicteric without injection Ears:  canals without lesions, TMs shiny without retraction, no obvious effusion, no erythema Nose:  nares patent, septum midline, mucosa normal, and no drainage or sinus tenderness Throat/Pharynx:  lips and gingiva without lesion; tongue and uvula midline; non-inflamed pharynx; no exudates or postnasal drainage Neck: neck supple without adenopathy, thyromegaly, or masses Lungs:  clear to auscultation, breath sounds equal bilaterally, no respiratory distress Cardio:  regular rate and rhythm, no LE edema Abdomen:  abdomen soft, nontender; bowel sounds normal; no masses or organomegaly Genital: Defer to GYN Musculoskeletal:  symmetrical muscle groups noted without atrophy or deformity Extremities:  no clubbing, cyanosis, or edema, no deformities, no skin discoloration Neuro:  gait normal; deep tendon reflexes normal and symmetric Psych: well oriented with normal range of affect and appropriate judgment/insight  Assessment and Plan  Well adult exam - Plan: CBC, Comprehensive metabolic panel, Lipid panel  Screen for colon cancer - Plan: Ambulatory referral to Gastroenterology  Screening for HIV without presence of risk factors - Plan: HIV Antibody (routine testing w rflx)  Encounter for hepatitis C screening test for low risk patient - Plan: Hepatitis C antibody  Weight gain - Plan: TSH  Rosacea - Plan: metroNIDAZOLE (METROCREAM) 0.75 % cream   Well  46 y.o. female. Counseled on diet and exercise. Other orders as above. Has mammogram scheduled.  Advanced directive form provided today.  CCS: Refer GI.  Follow up in 1 yr or prn. The patient voiced understanding and agreement to the plan.  Jilda Roche Beatty, DO 07/02/21 1:56 PM

## 2021-07-03 LAB — HIV ANTIBODY (ROUTINE TESTING W REFLEX): HIV 1&2 Ab, 4th Generation: NONREACTIVE

## 2021-07-03 LAB — CBC
HCT: 38 % (ref 36.0–46.0)
Hemoglobin: 12.6 g/dL (ref 12.0–15.0)
MCHC: 33.3 g/dL (ref 30.0–36.0)
MCV: 91.5 fl (ref 78.0–100.0)
Platelets: 339 10*3/uL (ref 150.0–400.0)
RBC: 4.15 Mil/uL (ref 3.87–5.11)
RDW: 12.9 % (ref 11.5–15.5)
WBC: 6 10*3/uL (ref 4.0–10.5)

## 2021-07-03 LAB — COMPREHENSIVE METABOLIC PANEL
ALT: 10 U/L (ref 0–35)
AST: 12 U/L (ref 0–37)
Albumin: 4.2 g/dL (ref 3.5–5.2)
Alkaline Phosphatase: 58 U/L (ref 39–117)
BUN: 10 mg/dL (ref 6–23)
CO2: 24 mEq/L (ref 19–32)
Calcium: 8.9 mg/dL (ref 8.4–10.5)
Chloride: 102 mEq/L (ref 96–112)
Creatinine, Ser: 0.88 mg/dL (ref 0.40–1.20)
GFR: 78.47 mL/min (ref >60.00)
Glucose, Bld: 146 mg/dL — ABNORMAL HIGH (ref 70–99)
Potassium: 3.9 mEq/L (ref 3.5–5.1)
Sodium: 136 mEq/L (ref 135–145)
Total Bilirubin: 0.5 mg/dL (ref 0.2–1.2)
Total Protein: 7.2 g/dL (ref 6.0–8.3)

## 2021-07-03 LAB — LIPID PANEL
Cholesterol: 263 mg/dL — ABNORMAL HIGH (ref 0–200)
HDL: 63.1 mg/dL (ref >39.00)
NonHDL: 200.15
Total CHOL/HDL Ratio: 4
Triglycerides: 253 mg/dL — ABNORMAL HIGH (ref 0.0–149.0)
VLDL: 50.6 mg/dL — ABNORMAL HIGH (ref 0.0–40.0)

## 2021-07-03 LAB — HEPATITIS C ANTIBODY
Hepatitis C Ab: NONREACTIVE
SIGNAL TO CUT-OFF: 0.12 (ref <1.00)

## 2021-07-03 LAB — LDL CHOLESTEROL, DIRECT: Direct LDL: 182 mg/dL

## 2021-07-03 LAB — TSH: TSH: 1.08 u[IU]/mL (ref 0.35–5.50)

## 2021-07-06 ENCOUNTER — Other Ambulatory Visit: Payer: Self-pay | Admitting: Family Medicine

## 2021-07-06 DIAGNOSIS — E785 Hyperlipidemia, unspecified: Secondary | ICD-10-CM

## 2021-08-07 ENCOUNTER — Other Ambulatory Visit (INDEPENDENT_AMBULATORY_CARE_PROVIDER_SITE_OTHER): Payer: Managed Care, Other (non HMO)

## 2021-08-07 DIAGNOSIS — E785 Hyperlipidemia, unspecified: Secondary | ICD-10-CM | POA: Diagnosis not present

## 2021-08-07 LAB — LIPID PANEL
Cholesterol: 234 mg/dL — ABNORMAL HIGH (ref 0–200)
HDL: 58.7 mg/dL (ref >39.00)
LDL Cholesterol: 155 mg/dL — ABNORMAL HIGH (ref 0–99)
NonHDL: 175.79
Total CHOL/HDL Ratio: 4
Triglycerides: 106 mg/dL (ref 0.0–149.0)
VLDL: 21.2 mg/dL (ref 0.0–40.0)

## 2021-11-10 ENCOUNTER — Ambulatory Visit (INDEPENDENT_AMBULATORY_CARE_PROVIDER_SITE_OTHER): Payer: 59 | Admitting: Family Medicine

## 2021-11-10 ENCOUNTER — Encounter: Payer: Self-pay | Admitting: Family Medicine

## 2021-11-10 VITALS — BP 120/80 | HR 89 | Temp 98.3°F | Ht 67.5 in | Wt 181.4 lb

## 2021-11-10 DIAGNOSIS — R1084 Generalized abdominal pain: Secondary | ICD-10-CM

## 2021-11-10 MED ORDER — PANTOPRAZOLE SODIUM 40 MG PO TBEC: 40.0000 mg | DELAYED_RELEASE_TABLET | Freq: Every day | ORAL | 1 refills | Status: AC

## 2021-11-10 NOTE — Patient Instructions (Addendum)
Please schedule your colonoscopy with the GI team.   The only lifestyle changes that have data behind them are weight loss for the overweight/obese and elevating the head of the bed. Finding out which foods/positions are triggers is important.  Let us know if you need anything.

## 2021-11-10 NOTE — Progress Notes (Signed)
Chief Complaint  Patient presents with   GI Problem    Subjective: Patient is a 46 y.o. female here for abdominal pain.  8 months ago, the patient drank a white claw and felt very bloated for short period of time.  This continues to happen every time she drank 1.  She went to the [facility] 4 months ago.  Eating large quantities of food and even 1 or 2 drinks caused her to have abdominal discomfort diffusely and bloating.  No bowel changes.  She has not tried anything at home.  This is progressively getting worse and now anything she eats will seem to flare her symptoms.  She is not having any vomiting, bleeding, nighttime awakenings, unexplained weight loss, fevers, skin changes.  Past Medical History:  Diagnosis Date   History of recurrent UTIs    Migraines    Rosacea     Objective: BP 120/80 (BP Location: Left Arm, Patient Position: Sitting, Cuff Size: Normal)   Pulse 89   Temp 98.3 F (36.8 C) (Oral)   Ht 5' 7.5" (1.715 m)   Wt 181 lb 6 oz (82.3 kg)   SpO2 97%   BMI 27.99 kg/m  General: Awake, appears stated age Heart: RRR, no LE edema Lungs: CTAB, no rales, wheezes or rhonchi. No accessory muscle use Abdomen: Bowel sounds present, soft, diffusely and mildly tender to palpation, negative Murphy's, McBurney's, Rovsing's, Carnett's, no masses or organomegaly Psych: Age appropriate judgment and insight, normal affect and mood  Assessment and Plan: Generalized abdominal pain - Plan: pantoprazole (PROTONIX) 40 MG tablet  Chronic, uncontrolled.  Start Protonix 40 mg daily.  Try to monitor food triggers.  I think she will only need this for around 60 days.  She also needs a follow-up with the gastroenterology team for colonoscopy.  This can be a contingency. The patient voiced understanding and agreement to the plan.  [place], DO 11/10/21  10:48 AM

## 2022-01-16 ENCOUNTER — Other Ambulatory Visit: Payer: Self-pay | Admitting: Family Medicine

## 2022-01-16 DIAGNOSIS — N920 Excessive and frequent menstruation with regular cycle: Secondary | ICD-10-CM

## 2022-03-04 ENCOUNTER — Other Ambulatory Visit: Payer: Self-pay

## 2022-03-04 ENCOUNTER — Emergency Department (HOSPITAL_COMMUNITY): Payer: BLUE CROSS/BLUE SHIELD

## 2022-03-04 ENCOUNTER — Emergency Department (HOSPITAL_COMMUNITY)
Admission: EM | Admit: 2022-03-04 | Discharge: 2022-03-04 | Disposition: A | Discharge status: Home / Self Care | Payer: BLUE CROSS/BLUE SHIELD | Attending: Emergency Medicine | Admitting: Emergency Medicine

## 2022-03-04 DIAGNOSIS — S52252A Displaced comminuted fracture of shaft of ulna, left arm, initial encounter for closed fracture: Secondary | ICD-10-CM | POA: Diagnosis not present

## 2022-03-04 DIAGNOSIS — Y9302 Activity, running: Secondary | ICD-10-CM | POA: Insufficient documentation

## 2022-03-04 DIAGNOSIS — W1830XA Fall on same level, unspecified, initial encounter: Secondary | ICD-10-CM | POA: Insufficient documentation

## 2022-03-04 DIAGNOSIS — S52022A Displaced fracture of olecranon process without intraarticular extension of left ulna, initial encounter for closed fracture: Secondary | ICD-10-CM

## 2022-03-04 DIAGNOSIS — S4992XA Unspecified injury of left shoulder and upper arm, initial encounter: Secondary | ICD-10-CM | POA: Diagnosis present

## 2022-03-04 MED ORDER — OXYCODONE-ACETAMINOPHEN 5-325 MG PO TABS: 1.0000 | ORAL_TABLET | ORAL | 0 refills | Status: AC | PRN

## 2022-03-04 MED ORDER — OXYCODONE-ACETAMINOPHEN 5-325 MG PO TABS
2.0000 | ORAL_TABLET | Freq: Once | ORAL | Status: AC
  Administered 2022-03-04: 2 via ORAL
  Filled 2022-03-04: qty 2

## 2022-03-04 NOTE — Discharge Instructions (Signed)
Take the prescribed medication as directed.  Do not drive while taking this. Follow-up with Dr. Griffin Basil-- his office should be contacting you today with follow-up appt. Return to the ED for new or worsening symptoms.
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
## 2022-03-04 NOTE — ED Notes (Signed)
Registration at bedside.

## 2022-03-04 NOTE — ED Triage Notes (Signed)
Pt ED visit following [facility] recommendation. Pt states having a mechanical fall yesterday. C/o left arm pain and found to have fracture. Pt has imaging available via CD at bedside. Pt arrives with splint and sling. Husband at bedside with pt.

## 2022-06-15 ENCOUNTER — Telehealth: Payer: Self-pay | Admitting: Family Medicine

## 2022-06-15 DIAGNOSIS — L719 Rosacea, unspecified: Secondary | ICD-10-CM

## 2022-06-15 MED ORDER — METRONIDAZOLE 0.75 % EX CREA: TOPICAL_CREAM | Freq: Two times a day (BID) | CUTANEOUS | 3 refills | Status: AC

## 2022-06-15 NOTE — Telephone Encounter (Signed)
Prescription Request  06/15/2022  Is this a "Controlled Substance" medicine? No  LOV: Visit date not found  What is the name of the medication or equipment?  1.metroNIDAZOLE (METROCREAM) 0.75 % cream  2. Doxycline  Have you contacted your pharmacy to request a refill? No   Which pharmacy would you like this sent to?  [facility]    [facility] [address] [place], [place] [address]  Patient notified that their request is being sent to the clinical staff for review and that they should receive a response within 2 business days.   Please advise at [facility] [phone]

## 2022-06-15 NOTE — Telephone Encounter (Signed)
Refill done.  

## 2022-11-26 LAB — HM MAMMOGRAPHY

## 2022-11-29 ENCOUNTER — Encounter: Payer: Self-pay | Admitting: Obstetrics and Gynecology

## 2023-05-16 ENCOUNTER — Encounter: Payer: Self-pay | Admitting: Family Medicine

## 2023-11-14 ENCOUNTER — Encounter: Payer: Self-pay | Admitting: Family Medicine

## 2023-11-15 ENCOUNTER — Encounter: Payer: Self-pay | Admitting: Family Medicine

## 2023-11-15 ENCOUNTER — Ambulatory Visit: Admitting: Family Medicine

## 2023-11-15 VITALS — BP 126/86 | HR 89 | Temp 98.0°F | Resp 16 | Ht 67.0 in | Wt 178.0 lb

## 2023-11-15 DIAGNOSIS — L989 Disorder of the skin and subcutaneous tissue, unspecified: Secondary | ICD-10-CM

## 2023-11-15 DIAGNOSIS — Z1283 Encounter for screening for malignant neoplasm of skin: Secondary | ICD-10-CM

## 2023-11-15 NOTE — Progress Notes (Signed)
 Chief Complaint  Patient presents with   Rash    Rash/Spot     Pamela Villa is a 48 y.o. female here for a skin complaint.  Duration: 5 months Location: face Pruritic? No Painful? No Drainage? No New soaps/lotions/topicals/detergents? No Trauma? No Other associated symptoms: slightly bigger;  Therapies tried thus far: metro gel and Azelaic acid for rosacea  Past Medical History:  Diagnosis Date   History of recurrent UTIs    Migraines    Rosacea     BP 126/86 (BP Location: Left Arm, Patient Position: Sitting)   Pulse 89   Temp 98 F (36.7 C) (Oral)   Resp 16   Ht 5' 7 (1.702 m)   Wt 178 lb (80.7 kg)   SpO2 96%   BMI 27.88 kg/m  Gen: awake, alert, appearing stated age Lungs: No accessory muscle use Skin: Erythematous malar rash noted.  On the right maxillary region, there is a 0.3 cm yellowish and slightly raised circular lesion. No drainage, erythema, TTP, fluctuance, excoriation Psych: Age appropriate judgment and insight  Skin lesion  Skin cancer screening - Plan: Ambulatory referral to Dermatology  Looks like seborrheic hyperplasia.  Reassurance for now.  She will let me know if there are any changes. Refer to the dermatology team for routine skin cancer screening. F/u as originally scheduled. The patient voiced understanding and agreement to the plan.  Mabel [place], DO 11/15/23 2:38 PM

## 2023-11-15 NOTE — Patient Instructions (Addendum)
 Let me know if there are any changes from this.  This looks like seborrheic hyperplasia to me which is un-concerning.  If you do not hear anything about your referral in the next 1-2 weeks, call our office and ask for an update.  Let us  know if you need anything.

## 2024-06-21 ENCOUNTER — Ambulatory Visit: Admitting: Dermatology
